# Patient Record
Sex: Female | Born: 1950 | Hispanic: Yes | Marital: Single
Health system: Southern US, Community
[De-identification: ages and names within clinical notes are randomized; demographics above are authoritative.]

---

## 2020-02-01 ENCOUNTER — Other Ambulatory Visit: Payer: Self-pay

## 2020-02-01 ENCOUNTER — Emergency Department (HOSPITAL_COMMUNITY): Payer: Self-pay

## 2020-02-01 ENCOUNTER — Emergency Department (HOSPITAL_COMMUNITY)
Admission: EM | Admit: 2020-02-01 | Discharge: 2020-02-01 | Disposition: A | Discharge status: Home / Self Care | Payer: Self-pay | Attending: Emergency Medicine | Admitting: Emergency Medicine

## 2020-02-01 DIAGNOSIS — Y92838 Other recreation area as the place of occurrence of the external cause: Secondary | ICD-10-CM | POA: Insufficient documentation

## 2020-02-01 DIAGNOSIS — Y9389 Activity, other specified: Secondary | ICD-10-CM | POA: Insufficient documentation

## 2020-02-01 DIAGNOSIS — W01198A Fall on same level from slipping, tripping and stumbling with subsequent striking against other object, initial encounter: Secondary | ICD-10-CM | POA: Insufficient documentation

## 2020-02-01 DIAGNOSIS — W19XXXA Unspecified fall, initial encounter: Secondary | ICD-10-CM

## 2020-02-01 DIAGNOSIS — Y998 Other external cause status: Secondary | ICD-10-CM | POA: Insufficient documentation

## 2020-02-01 DIAGNOSIS — S0101XA Laceration without foreign body of scalp, initial encounter: Secondary | ICD-10-CM | POA: Insufficient documentation

## 2020-02-01 DIAGNOSIS — S0990XA Unspecified injury of head, initial encounter: Secondary | ICD-10-CM

## 2020-02-01 MED ORDER — ACETAMINOPHEN 325 MG PO TABS
650.0000 mg | ORAL_TABLET | Freq: Once | ORAL | Status: AC
  Administered 2020-02-01: 650 mg via ORAL
  Filled 2020-02-01: qty 2

## 2020-02-01 MED ORDER — LIDOCAINE-EPINEPHRINE (PF) 2 %-1:200000 IJ SOLN
10.0000 mL | Freq: Once | INTRAMUSCULAR | Status: AC
  Administered 2020-02-01: 10 mL
  Filled 2020-02-01: qty 20

## 2020-02-01 NOTE — Discharge Instructions (Signed)
Please read and follow all provided instructions.  Your diagnoses today include:  1. Laceration of scalp, initial encounter   2. Minor head injury, initial encounter   3. Fall, initial encounter     Tests performed today include:  CT scan of your head and cervical spine that did not show any serious injury.  EKG  Vital signs. See below for your results today.   Medications prescribed:   None  Take any prescribed medications only as directed.  Home care instructions:  Follow any educational materials contained in this packet.  BE VERY CAREFUL not to take multiple medicines containing Tylenol (also called acetaminophen). Doing so can lead to an overdose which can damage your liver and cause liver failure and possibly death.   Follow-up instructions: Please follow-up with your primary care provider in 7 days for staple removal.    Return instructions:  SEEK IMMEDIATE MEDICAL ATTENTION IF:  There is confusion or drowsiness (although children frequently become drowsy after injury).   You cannot awaken the injured person.   You have more than one episode of vomiting.   You notice dizziness or unsteadiness which is getting worse, or inability to walk.   You have convulsions or unconsciousness.   You experience severe, persistent headaches not relieved by Tylenol.  You cannot use arms or legs normally.   There are changes in pupil sizes. (This is the black center in the colored part of the eye)   There is clear or bloody discharge from the nose or ears.   You have change in speech, vision, swallowing, or understanding.   Localized weakness, numbness, tingling, or change in bowel or bladder control.  You have any other emergent concerns.  Additional Information: You have had a head injury which does not appear to require admission at this time.  Your vital signs today were: BP (!) 154/72   Pulse 62   Temp 98 F (36.7 C) (Oral)   Ht 5' (1.524 m)   Wt 59 kg    SpO2 100%   BMI 25.39 kg/m  If your blood pressure (BP) was elevated above 135/85 this visit, please have this repeated by your doctor within one month. --------------
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
## 2023-01-19 ENCOUNTER — Other Ambulatory Visit: Payer: Self-pay | Admitting: Internal Medicine

## 2023-01-19 DIAGNOSIS — K760 Fatty (change of) liver, not elsewhere classified: Secondary | ICD-10-CM

## 2023-02-19 ENCOUNTER — Other Ambulatory Visit: Payer: Self-pay
